# Patient Record
Sex: Female | Born: 1995 | Hispanic: Yes | Smoking: Never smoker
Health system: Southern US, Community
[De-identification: ages and names within clinical notes are randomized; demographics above are authoritative.]

## PROBLEM LIST (undated history)

## (undated) ENCOUNTER — Inpatient Hospital Stay: Payer: Self-pay

## (undated) DIAGNOSIS — F329 Major depressive disorder, single episode, unspecified: Secondary | ICD-10-CM

## (undated) DIAGNOSIS — E0590 Thyrotoxicosis, unspecified without thyrotoxic crisis or storm: Secondary | ICD-10-CM

## (undated) DIAGNOSIS — F32A Depression, unspecified: Secondary | ICD-10-CM

---
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
## 2017-01-16 ENCOUNTER — Other Ambulatory Visit: Payer: Self-pay | Admitting: Family Medicine

## 2017-01-16 DIAGNOSIS — Z3402 Encounter for supervision of normal first pregnancy, second trimester: Secondary | ICD-10-CM

## 2017-02-01 ENCOUNTER — Ambulatory Visit: Payer: Medicaid Other

## 2017-02-06 ENCOUNTER — Ambulatory Visit: Payer: Medicaid Other

## 2017-02-08 ENCOUNTER — Ambulatory Visit
Admission: RE | Admit: 2017-02-08 | Discharge: 2017-02-08 | Disposition: A | Discharge status: Home / Self Care | Payer: Medicaid Other | Source: Ambulatory Visit | Attending: Family Medicine | Admitting: Family Medicine

## 2017-02-08 DIAGNOSIS — Z3A18 18 weeks gestation of pregnancy: Secondary | ICD-10-CM | POA: Insufficient documentation

## 2017-02-08 DIAGNOSIS — Z3402 Encounter for supervision of normal first pregnancy, second trimester: Secondary | ICD-10-CM

## 2017-07-01 ENCOUNTER — Observation Stay
Admission: EM | Admit: 2017-07-01 | Discharge: 2017-07-01 | Disposition: A | Discharge status: Home / Self Care | Payer: Medicaid Other | Attending: Obstetrics and Gynecology | Admitting: Obstetrics and Gynecology

## 2017-07-01 ENCOUNTER — Encounter: Payer: Self-pay | Admitting: *Deleted

## 2017-07-01 DIAGNOSIS — O471 False labor at or after 37 completed weeks of gestation: Principal | ICD-10-CM | POA: Insufficient documentation

## 2017-07-01 DIAGNOSIS — Z3A40 40 weeks gestation of pregnancy: Secondary | ICD-10-CM | POA: Diagnosis not present

## 2017-07-01 DIAGNOSIS — O4290 Premature rupture of membranes, unspecified as to length of time between rupture and onset of labor, unspecified weeks of gestation: Secondary | ICD-10-CM

## 2017-07-01 DIAGNOSIS — O169 Unspecified maternal hypertension, unspecified trimester: Secondary | ICD-10-CM

## 2017-07-01 DIAGNOSIS — O26893 Other specified pregnancy related conditions, third trimester: Secondary | ICD-10-CM

## 2017-07-01 HISTORY — DX: Major depressive disorder, single episode, unspecified: F32.9

## 2017-07-01 HISTORY — DX: Depression, unspecified: F32.A

## 2017-07-01 HISTORY — DX: Thyrotoxicosis, unspecified without thyrotoxic crisis or storm: E05.90

## 2017-07-01 LAB — URINALYSIS, ROUTINE W REFLEX MICROSCOPIC
Bilirubin Urine: NEGATIVE
Glucose, UA: NEGATIVE mg/dL
Hgb urine dipstick: NEGATIVE
Ketones, ur: NEGATIVE mg/dL
LEUKOCYTES UA: NEGATIVE
NITRITE: NEGATIVE
Protein, ur: NEGATIVE mg/dL
SPECIFIC GRAVITY, URINE: 1.009 (ref 1.005–1.030)
pH: 7 (ref 5.0–8.0)

## 2017-07-01 NOTE — Discharge Instructions (Signed)

## 2017-07-01 NOTE — Final Progress Note (Signed)
L&D OB Triage Note  Sandy Hansen is a 22 y.o. unassigned G1P0000 female at 2610w1d, EDD Estimated Date of Delivery: 06/30/17 who presented to triage for complaints of possibly leaking fluid over the past hour.  [facility] She was evaluated by the nurses with no significant findings for ruptured membranes. Vital signs stable (except first 2 BPs with mildly elevated diastolic of 90). An NST was performed and has been reviewed by MD.    Vitals: BPs 128/90, 129/92, 120/89 Vitals:   07/01/17 2211  Temp: 98.3 F (36.8 C)  TempSrc: Oral  Weight: 180 lb (81.6 kg)  Height: 5\' 8"  (1.727 m)   Physical Exam:  Cervix assessed by nurse, closed.    NST INTERPRETATION: Indications: rule out uterine contractions  Mode: External Baseline Rate (A): 135 bpm Variability: Moderate Accelerations: 15 x 15 Decelerations: None     Contraction Frequency (min): 7  Impression: reactive   Labs: Results for orders placed or performed during the hospital encounter of 07/01/17  Urinalysis, Routine w reflex microscopic  Result Value Ref Range   Color, Urine YELLOW (A) YELLOW   APPearance HAZY (A) CLEAR   Specific Gravity, Urine 1.009 1.005 - 1.030   pH 7.0 5.0 - 8.0   Glucose, UA NEGATIVE NEGATIVE mg/dL   Hgb urine dipstick NEGATIVE NEGATIVE   Bilirubin Urine NEGATIVE NEGATIVE   Ketones, ur NEGATIVE NEGATIVE mg/dL   Protein, ur NEGATIVE NEGATIVE mg/dL   Nitrite NEGATIVE NEGATIVE   Leukocytes, UA NEGATIVE NEGATIVE      Plan: NST performed was reviewed and was found to be reactive. Urine with no significant proteinuria. BPs returned to normal. She was discharged home with bleeding/labor precautions.  Continue routine prenatal care. Follow up with OB/GYN as previously scheduled.     Hildred LaserAnika Lennon Boutwell, MD

## 2017-07-02 DIAGNOSIS — O169 Unspecified maternal hypertension, unspecified trimester: Secondary | ICD-10-CM

## 2017-09-22 ENCOUNTER — Encounter (HOSPITAL_COMMUNITY): Payer: Self-pay

## 2018-11-10 ENCOUNTER — Other Ambulatory Visit: Payer: Self-pay | Admitting: Internal Medicine

## 2018-11-10 ENCOUNTER — Other Ambulatory Visit: Payer: Self-pay

## 2018-11-10 DIAGNOSIS — Z20822 Contact with and (suspected) exposure to covid-19: Secondary | ICD-10-CM

## 2018-11-14 LAB — NOVEL CORONAVIRUS, NAA: SARS-CoV-2, NAA: NOT DETECTED

## 2018-11-18 ENCOUNTER — Telehealth: Payer: Self-pay | Admitting: Family Medicine

## 2018-11-18 NOTE — Telephone Encounter (Signed)
Pt given (Not Detected) covid results/ Pt verbalized understanding

## 2019-08-15 ENCOUNTER — Other Ambulatory Visit: Payer: Self-pay

## 2019-08-15 ENCOUNTER — Emergency Department: Payer: Self-pay

## 2019-08-15 DIAGNOSIS — R0602 Shortness of breath: Secondary | ICD-10-CM | POA: Insufficient documentation

## 2019-08-15 DIAGNOSIS — F419 Anxiety disorder, unspecified: Secondary | ICD-10-CM | POA: Insufficient documentation

## 2019-08-15 DIAGNOSIS — E039 Hypothyroidism, unspecified: Secondary | ICD-10-CM | POA: Insufficient documentation

## 2019-08-15 DIAGNOSIS — Z79899 Other long term (current) drug therapy: Secondary | ICD-10-CM | POA: Insufficient documentation

## 2019-08-15 DIAGNOSIS — R002 Palpitations: Secondary | ICD-10-CM | POA: Insufficient documentation

## 2019-08-15 NOTE — ED Triage Notes (Signed)
Pt also complains of left shoulder pain, "wobbly" feeling.

## 2019-08-15 NOTE — ED Notes (Signed)
Spoke with dr. Derrill Kay regarding pt's symptoms and protocol orders placed. Order for pregnancy test received.

## 2019-08-15 NOTE — ED Triage Notes (Signed)
Via interpreter with Farrel Gobble [phone], pt states she became nervous, felt shob and felt like she was going to vomit. Pt states she had tingling in hands and feet. Pt states occurred at 2100. Pt appears in no acute distress. Pt states she feels shob and nervous.

## 2019-08-16 ENCOUNTER — Emergency Department
Admission: EM | Admit: 2019-08-16 | Discharge: 2019-08-16 | Disposition: A | Discharge status: Home / Self Care | Payer: Self-pay | Attending: Emergency Medicine | Admitting: Emergency Medicine

## 2019-08-16 DIAGNOSIS — F419 Anxiety disorder, unspecified: Secondary | ICD-10-CM

## 2019-08-16 LAB — POCT PREGNANCY, URINE: Preg Test, Ur: NEGATIVE

## 2019-08-16 LAB — CBC WITH DIFFERENTIAL/PLATELET
Abs Immature Granulocytes: 0.02 10*3/uL (ref 0.00–0.07)
Basophils Absolute: 0.1 10*3/uL (ref 0.0–0.1)
Basophils Relative: 1 %
Eosinophils Absolute: 0 10*3/uL (ref 0.0–0.5)
Eosinophils Relative: 1 %
HCT: 42.3 % (ref 36.0–46.0)
Hemoglobin: 14.4 g/dL (ref 12.0–15.0)
Immature Granulocytes: 0 %
Lymphocytes Relative: 29 %
Lymphs Abs: 2.1 10*3/uL (ref 0.7–4.0)
MCH: 29 pg (ref 26.0–34.0)
MCHC: 34 g/dL (ref 30.0–36.0)
MCV: 85.3 fL (ref 80.0–100.0)
Monocytes Absolute: 0.3 10*3/uL (ref 0.1–1.0)
Monocytes Relative: 4 %
Neutro Abs: 4.9 10*3/uL (ref 1.7–7.7)
Neutrophils Relative %: 65 %
Platelets: 245 10*3/uL (ref 150–400)
RBC: 4.96 MIL/uL (ref 3.87–5.11)
RDW: 12.8 % (ref 11.5–15.5)
WBC: 7.4 10*3/uL (ref 4.0–10.5)
nRBC: 0 % (ref 0.0–0.2)

## 2019-08-16 LAB — BASIC METABOLIC PANEL
Anion gap: 9 (ref 5–15)
BUN: 7 mg/dL (ref 6–20)
CO2: 23 mmol/L (ref 22–32)
Calcium: 9.2 mg/dL (ref 8.9–10.3)
Chloride: 107 mmol/L (ref 98–111)
Creatinine, Ser: 0.58 mg/dL (ref 0.44–1.00)
GFR calc Af Amer: >60 mL/min (ref >60)
GFR calc non Af Amer: >60 mL/min (ref >60)
Glucose, Bld: 110 mg/dL — ABNORMAL HIGH (ref 70–99)
Potassium: 3.9 mmol/L (ref 3.5–5.1)
Sodium: 139 mmol/L (ref 135–145)

## 2019-08-16 LAB — TSH: TSH: 2.893 u[IU]/mL (ref 0.350–4.500)

## 2019-08-16 LAB — PREGNANCY, URINE: Preg Test, Ur: NEGATIVE

## 2019-08-16 LAB — FIBRIN DERIVATIVES D-DIMER (ARMC ONLY): Fibrin derivatives D-dimer (ARMC): 214.99 ng/mL (FEU) (ref 0.00–499.00)

## 2019-08-16 LAB — T4, FREE: Free T4: 0.89 ng/dL (ref 0.61–1.12)

## 2019-08-16 MED ORDER — HYDROXYZINE HCL 25 MG PO TABS: 25.0000 mg | ORAL_TABLET | Freq: Three times a day (TID) | ORAL | 0 refills | Status: DC | PRN

## 2019-08-16 MED ORDER — SODIUM CHLORIDE 0.9 % IV BOLUS
1000.0000 mL | Freq: Once | INTRAVENOUS | Status: AC
  Administered 2019-08-16: 04:00:00 1000 mL via INTRAVENOUS

## 2019-08-16 NOTE — ED Notes (Signed)
Pt sitting in lobby, texting on phone in no acute distress.

## 2019-08-16 NOTE — Discharge Instructions (Signed)
Your EKG, chest xray and lab tests were all okay today.  Please take hydroxyzine as needed for anxiety, and follow up with your doctor for continued monitoring of your symptoms.

## 2019-08-16 NOTE — ED Provider Notes (Signed)
[facility] Emergency Department Provider Note  ____________________________________________  Time seen: Approximately 4:55 AM  I have reviewed the triage vital signs and the nursing notes.   HISTORY  Chief Complaint Shortness of Breath  Encounter completed with Spanish video interpreter  HPI Sandy Haye is a 24 y.o. female with a history of hypothyroidism and depression who comes the ED complaining of anxiousness, nausea, palpitations, bilateral hands and feet tingling.  Also associated with a feeling of shortness of breath.  Started today at about 9:00 PM, was at its worse initially, and then gradually improved.  Feels like it is mostly gone but still present and she is worried that it may become worse again.  Denies any recent travel trauma hospitalization or surgery.  No history of DVT or PE.  No exogenous hormone use.  States that the feeling is similar to when she had hyperthyroidism.  She is continuing medication for thyroid dysfunction.      Past Medical History:  Diagnosis Date  . Depression   . Hyperthyroidism      Patient Active Problem List   Diagnosis Date Noted  . Elevated blood pressure affecting pregnancy, antepartum 07/02/2017  . Amniotic fluid leaking 07/01/2017  . Labor and delivery indication for care or intervention 07/01/2017     No past surgical history on file.   Prior to Admission medications   Medication Sig Start Date End Date Taking? Authorizing Provider  hydrOXYzine (ATARAX/VISTARIL) 25 MG tablet Take 1 tablet (25 mg total) by mouth 3 (three) times daily as needed for anxiety. 08/16/19   Carrie Mew, MD  Prenatal Vit-Fe Fumarate-FA (MULTIVITAMIN-PRENATAL) 27-0.8 MG TABS tablet Take 1 tablet by mouth daily at 12 noon.    [provider]     Allergies Sulfa antibiotics, Sulfasalazine, and Trimethoprim   No family history on file.  Social History Social History   Tobacco Use  . Smoking status:  Never Smoker  Substance Use Topics  . Alcohol use: No  . Drug use: No    Review of Systems  Constitutional:   No fever or chills.  ENT:   No sore throat. No rhinorrhea. Cardiovascular:   No chest pain or syncope. Respiratory: Positive shortness of breath without cough. Gastrointestinal:   Negative for abdominal pain, vomiting and diarrhea.  Musculoskeletal:   Negative for focal pain or swelling All other systems reviewed and are negative except as documented above in ROS and HPI.  ____________________________________________   PHYSICAL EXAM:  VITAL SIGNS: ED Triage Vitals  Enc Vitals Group     BP 08/15/19 2113 132/73     Pulse Rate 08/15/19 2113 (!) 104     Resp 08/15/19 2113 18     Temp 08/15/19 2113 98.4 F (36.9 C)     Temp Source 08/15/19 2113 Oral     SpO2 08/15/19 2113 100 %     Weight 08/15/19 2125 172 lb (78 kg)     Height 08/15/19 2125 5\' 4"  (1.626 m)     Head Circumference --      Peak Flow --      Pain Score 08/15/19 2124 4     Pain Loc --      Pain Edu? --      Excl. in [place]? --     Vital signs reviewed, nursing assessments reviewed.   Constitutional:   Alert and oriented. Non-toxic appearance. Eyes:   Conjunctivae are normal. EOMI. PERRL. ENT      Head:   Normocephalic and atraumatic.  Nose:   Wearing a mask.      Mouth/Throat:   Wearing a mask.      Neck:   No meningismus. Full ROM. Hematological/Lymphatic/Immunilogical:   No cervical lymphadenopathy. Cardiovascular:   Tachycardia heart rate 100. Symmetric bilateral radial and DP pulses.  No murmurs. Cap refill less than 2 seconds. Respiratory:   Normal respiratory effort without tachypnea/retractions. Breath sounds are clear and equal bilaterally. No wheezes/rales/rhonchi. Gastrointestinal:   Soft and nontender. Non distended. There is no CVA tenderness.  No rebound, rigidity, or guarding. Musculoskeletal:   Normal range of motion in all extremities. No joint effusions.  No lower extremity  tenderness.  No edema. Neurologic:   Normal speech and language.  Motor grossly intact. No acute focal neurologic deficits are appreciated.  Skin:    Skin is warm, dry and intact. No rash noted.  No petechiae, purpura, or bullae.  ____________________________________________    LABS (pertinent positives/negatives) (all labs ordered are listed, but only abnormal results are displayed) Labs Reviewed  BASIC METABOLIC PANEL - Abnormal; Notable for the following components:      Result Value   Glucose, Bld 110 (*)    All other components within normal limits  PREGNANCY, URINE  CBC WITH DIFFERENTIAL/PLATELET  FIBRIN DERIVATIVES D-DIMER ([facility] ONLY)  TSH  T4, FREE  POCT PREGNANCY, URINE   ____________________________________________   EKG  Interpreted by me Sinus tachycardia rate 106.  Normal axis intervals QRS ST segments and T waves.  No evidence of right heart strain  ____________________________________________    RADIOLOGY  DG Chest 2 View  Result Date: 08/15/2019 CLINICAL DATA:  Shortness of breath. EXAM: CHEST - 2 VIEW COMPARISON:  None. FINDINGS: The cardiomediastinal contours are normal. The lungs are clear. Pulmonary vasculature is normal. No consolidation, pleural effusion, or pneumothorax. No acute osseous abnormalities are seen. IMPRESSION: Negative radiographs of the chest. Electronically Signed   By: Narda Rutherford M.D.   On: 08/15/2019 22:02    ____________________________________________   PROCEDURES Procedures  ____________________________________________  DIFFERENTIAL DIAGNOSIS   Hyperthyroidism, pulmonary embolism, anxiety, atrial fibrillation, electrolyte abnormality  CLINICAL IMPRESSION / ASSESSMENT AND PLAN / ED COURSE  Medications ordered in the ED: Medications  sodium chloride 0.9 % bolus 1,000 mL (1,000 mLs Intravenous New Bag/Given 08/16/19 0356)    Pertinent labs & imaging results that were available during my care of the patient were  reviewed by me and considered in my medical decision making (see chart for details).  Laylonie Marzec was evaluated in Emergency Department on 08/16/2019 for the symptoms described in the history of present illness. She was evaluated in the context of the global COVID-19 pandemic, which necessitated consideration that the patient might be at risk for infection with the SARS-CoV-2 virus that causes COVID-19. Institutional protocols and algorithms that pertain to the evaluation of patients at risk for COVID-19 are in a state of rapid change based on information released by regulatory bodies including the CDC and federal and state organizations. These policies and algorithms were followed during the patient's care in the ED.   Patient presents with palpitations and feeling of anxiousness.  Most likely due to anxiety itself, but with her medical history as well as tachycardia and shortness of breath, check labs to evaluate thyroid function, electrolytes, and D-dimer to risk stratify for VTE.  ----------------------------------------- 4:59 AM on 08/16/2019 -----------------------------------------  Labs are all normal, vital signs are normal after IV fluids for hydration.  Stable for discharge home to follow-up with primary care.  Trial  of Vistaril as needed.      ____________________________________________   FINAL CLINICAL IMPRESSION(S) / ED DIAGNOSES    Final diagnoses:  Anxiety     ED Discharge Orders         Ordered    hydrOXYzine (ATARAX/VISTARIL) 25 MG tablet  3 times daily PRN     08/16/19 0455          Portions of this note were generated with dragon dictation software. Dictation errors may occur despite best attempts at proofreading.   Sharman Cheek, MD 08/16/19 [id number]

## 2021-09-11 IMAGING — CR DG CHEST 2V
1 series · 2 of 2 positions shown · non-contrast
Comparison: None.

CLINICAL DATA: Shortness of breath.

EXAM:
CHEST - 2 VIEW

[Series 1: dg chest 2 view · 0.14mm/px · 2 of 2 slices shown]
[im 1/2]
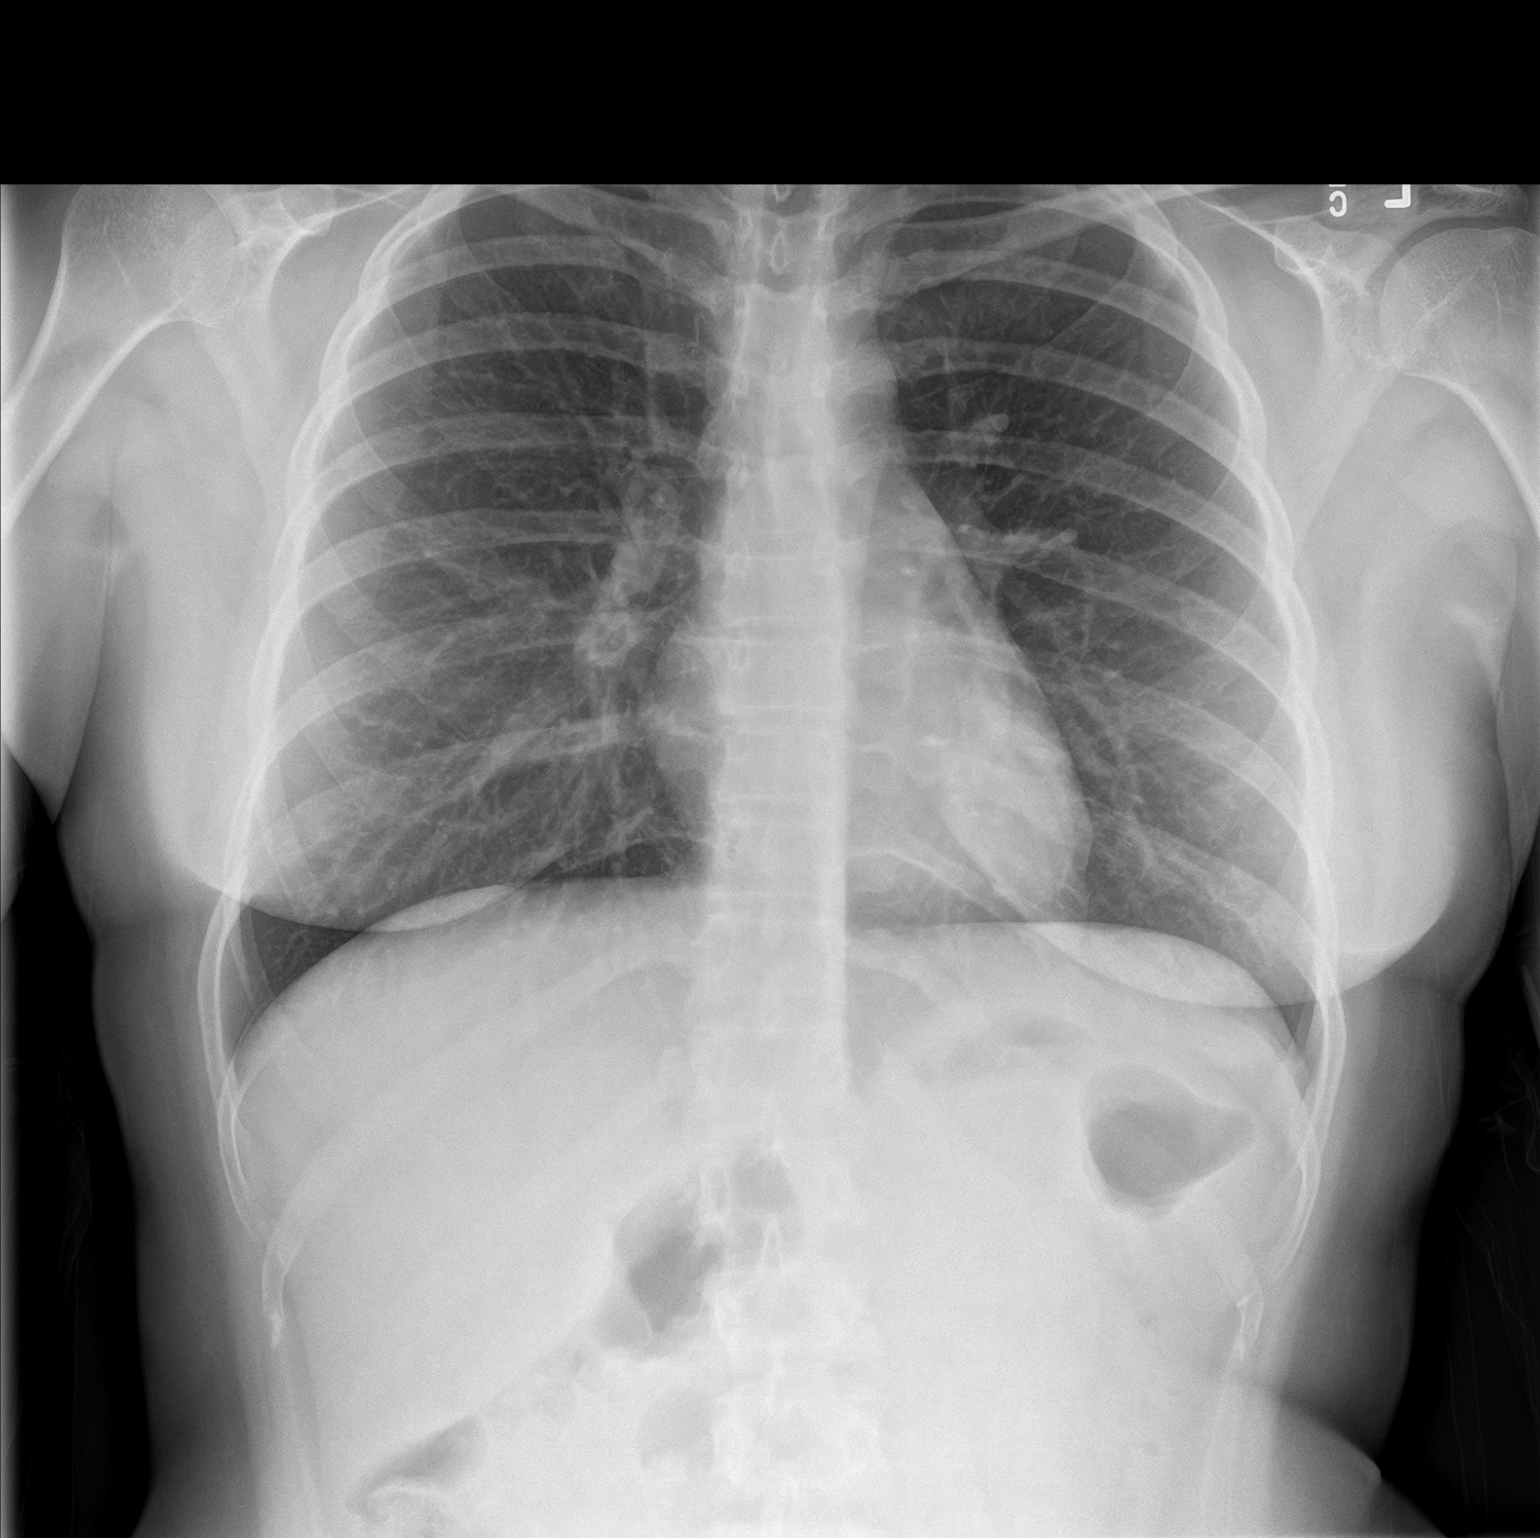
[im 2/2]
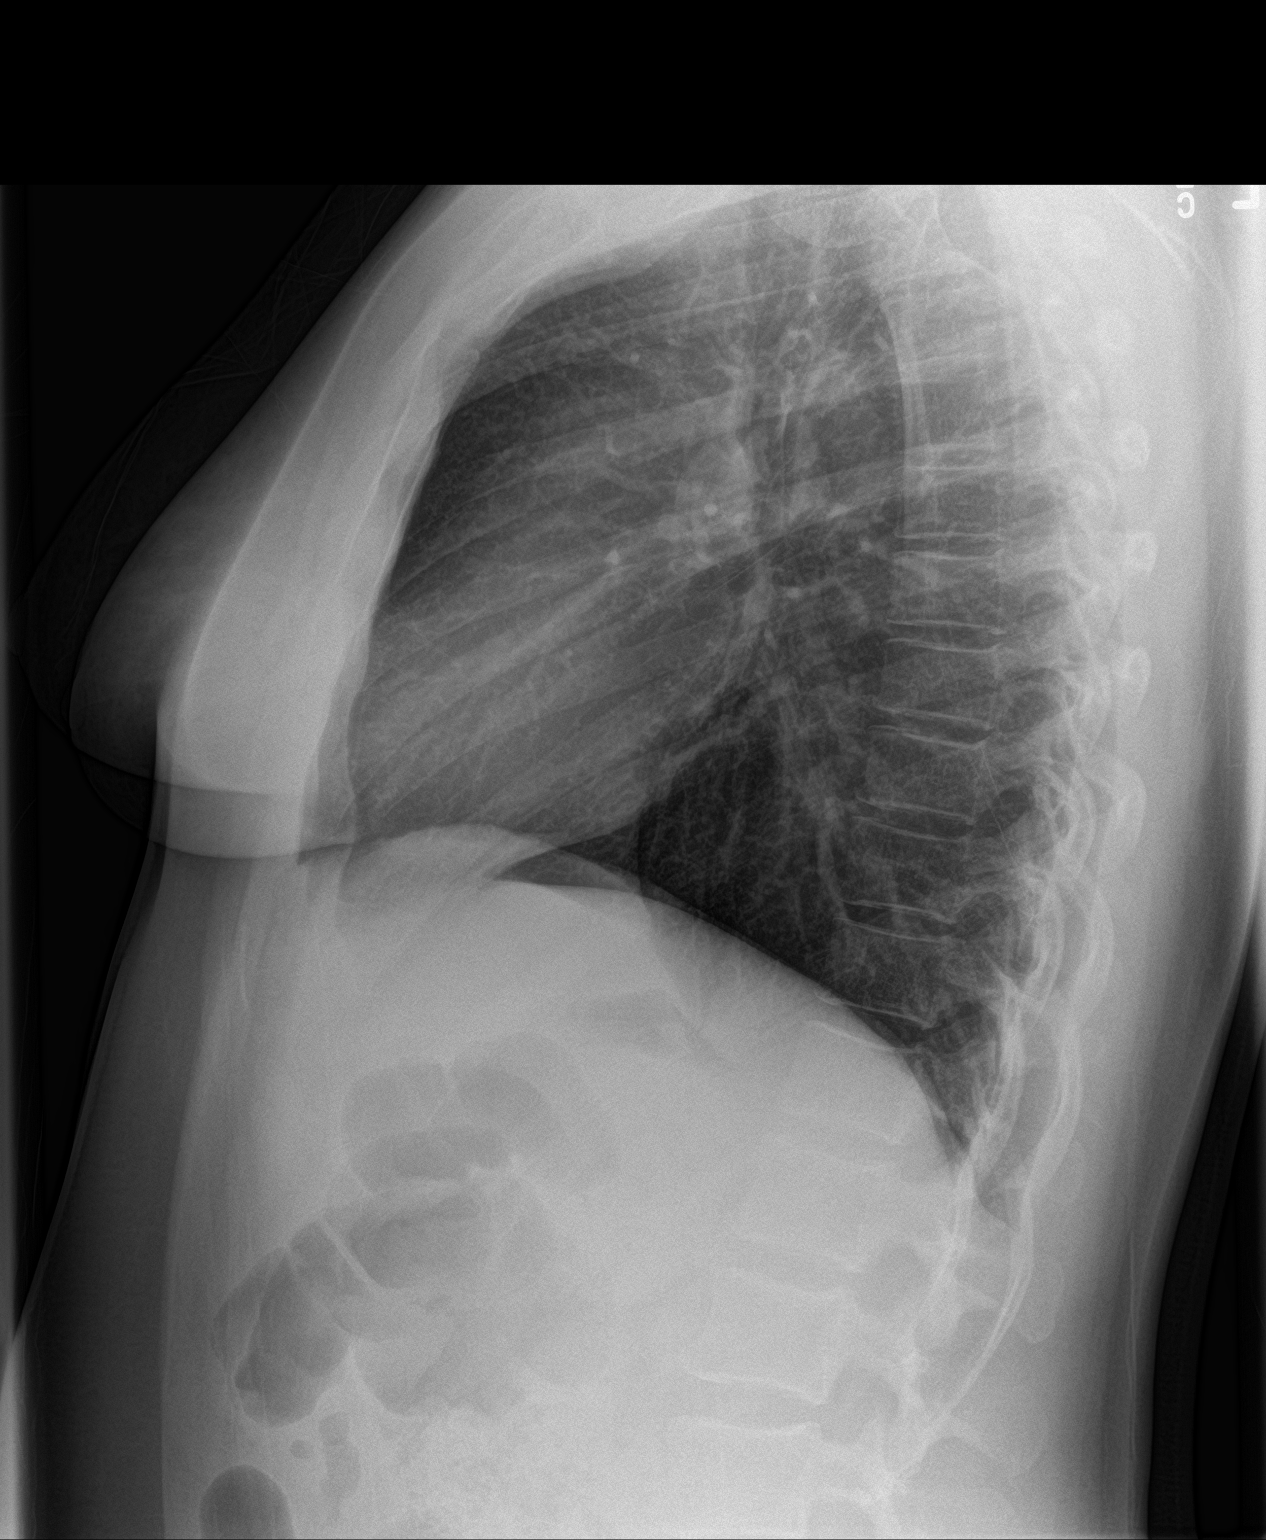

[2 of 2 positions shown; findings below may reference images not displayed]

FINDINGS: The cardiomediastinal contours are normal. The lungs are clear.
Pulmonary vasculature is normal. No consolidation, pleural effusion,
or pneumothorax. No acute osseous abnormalities are seen.
IMPRESSION: Negative radiographs of the chest.

## 2022-05-31 ENCOUNTER — Ambulatory Visit
Admission: RE | Admit: 2022-05-31 | Discharge: 2022-05-31 | Disposition: A | Discharge status: Home / Self Care | Payer: Worker's Compensation | Source: Ambulatory Visit | Attending: Physician Assistant | Admitting: Physician Assistant

## 2022-05-31 ENCOUNTER — Other Ambulatory Visit: Payer: Self-pay | Admitting: Physician Assistant

## 2022-05-31 ENCOUNTER — Ambulatory Visit
Admission: RE | Admit: 2022-05-31 | Discharge: 2022-05-31 | Disposition: A | Discharge status: Home / Self Care | Payer: Worker's Compensation | Attending: Physician Assistant | Admitting: Physician Assistant

## 2022-05-31 DIAGNOSIS — M25572 Pain in left ankle and joints of left foot: Secondary | ICD-10-CM

## 2022-05-31 DIAGNOSIS — M7989 Other specified soft tissue disorders: Secondary | ICD-10-CM | POA: Diagnosis present

## 2023-01-29 ENCOUNTER — Ambulatory Visit (LOCAL_COMMUNITY_HEALTH_CENTER): Payer: Self-pay

## 2023-01-29 VITALS — BP 110/71 | Ht 66.0 in | Wt 178.5 lb

## 2023-01-29 DIAGNOSIS — Z3009 Encounter for other general counseling and advice on contraception: Secondary | ICD-10-CM

## 2023-01-29 DIAGNOSIS — Z3201 Encounter for pregnancy test, result positive: Secondary | ICD-10-CM

## 2023-01-29 LAB — PREGNANCY, URINE: Preg Test, Ur: POSITIVE — AB

## 2023-01-29 MED ORDER — PRENATAL VITAMINS 28-0.8 MG PO TABS: 28.0000 mg | ORAL_TABLET | Freq: Every day | ORAL | Status: AC

## 2023-01-29 NOTE — Progress Notes (Signed)
UPT positive.  Plans prenatal care at [facility] and says has appt in October.  Pts mother accompanies her during visit.  Pt speaks a little [place] but M. [place] provided Product manager.   C/o occasional abdominal discomfort but no bleeding.  Discussed warning signs of ectopic pregnancy and seek emergency care if occurs.    See flow sheet.  Takes Levothyroxine 25 mg daily  and states also takes hydroxyzine sometimes for anxiety.  Given safe meds during pregnancy information and instructed followup with OB provider re meds.   The patient was dispensed prenatal vitamins today. I provided counseling today regarding the medication. Patient given the opportunity to ask questions. Questions answered.    Cherlynn Polo, RN

## 2023-03-06 DIAGNOSIS — R7303 Prediabetes: Secondary | ICD-10-CM | POA: Insufficient documentation

## 2023-03-12 DIAGNOSIS — F4322 Adjustment disorder with anxiety: Secondary | ICD-10-CM | POA: Insufficient documentation

## 2023-04-17 ENCOUNTER — Other Ambulatory Visit: Payer: Self-pay | Admitting: Primary Care

## 2023-04-17 DIAGNOSIS — Z3482 Encounter for supervision of other normal pregnancy, second trimester: Secondary | ICD-10-CM

## 2023-04-30 ENCOUNTER — Ambulatory Visit
Admission: RE | Admit: 2023-04-30 | Discharge: 2023-04-30 | Disposition: A | Discharge status: Home / Self Care | Payer: Medicaid Other | Source: Ambulatory Visit | Attending: Primary Care | Admitting: Primary Care

## 2023-04-30 DIAGNOSIS — Z3482 Encounter for supervision of other normal pregnancy, second trimester: Secondary | ICD-10-CM | POA: Diagnosis present

## 2023-04-30 DIAGNOSIS — Z3A18 18 weeks gestation of pregnancy: Secondary | ICD-10-CM | POA: Diagnosis not present

## 2023-08-07 ENCOUNTER — Observation Stay: Admission: EM | Admit: 2023-08-07 | Discharge: 2023-08-07 | Disposition: A | Discharge status: Home / Self Care

## 2023-08-07 DIAGNOSIS — O99283 Endocrine, nutritional and metabolic diseases complicating pregnancy, third trimester: Secondary | ICD-10-CM | POA: Insufficient documentation

## 2023-08-07 DIAGNOSIS — O4290 Premature rupture of membranes, unspecified as to length of time between rupture and onset of labor, unspecified weeks of gestation: Principal | ICD-10-CM | POA: Diagnosis present

## 2023-08-07 DIAGNOSIS — Z0371 Encounter for suspected problem with amniotic cavity and membrane ruled out: Secondary | ICD-10-CM | POA: Diagnosis present

## 2023-08-07 DIAGNOSIS — O09299 Supervision of pregnancy with other poor reproductive or obstetric history, unspecified trimester: Secondary | ICD-10-CM | POA: Insufficient documentation

## 2023-08-07 DIAGNOSIS — E0590 Thyrotoxicosis, unspecified without thyrotoxic crisis or storm: Secondary | ICD-10-CM | POA: Diagnosis not present

## 2023-08-07 DIAGNOSIS — Z3A32 32 weeks gestation of pregnancy: Secondary | ICD-10-CM | POA: Insufficient documentation

## 2023-08-07 DIAGNOSIS — O4193X0 Disorder of amniotic fluid and membranes, unspecified, third trimester, not applicable or unspecified: Principal | ICD-10-CM | POA: Insufficient documentation

## 2023-08-07 DIAGNOSIS — Z79899 Other long term (current) drug therapy: Secondary | ICD-10-CM | POA: Insufficient documentation

## 2023-08-07 LAB — URINALYSIS, COMPLETE (UACMP) WITH MICROSCOPIC
Bilirubin Urine: NEGATIVE
Glucose, UA: NEGATIVE mg/dL
Hgb urine dipstick: NEGATIVE
Ketones, ur: NEGATIVE mg/dL
Leukocytes,Ua: NEGATIVE
Nitrite: NEGATIVE
Protein, ur: NEGATIVE mg/dL
Specific Gravity, Urine: 1.019 (ref 1.005–1.030)
pH: 6 (ref 5.0–8.0)

## 2023-08-07 LAB — CHLAMYDIA/NGC RT PCR (ARMC ONLY)
Chlamydia Tr: NOT DETECTED
N gonorrhoeae: NOT DETECTED

## 2023-08-07 LAB — WET PREP, GENITAL
Clue Cells Wet Prep HPF POC: NONE SEEN
Sperm: NONE SEEN
Trich, Wet Prep: NONE SEEN
WBC, Wet Prep HPF POC: >=10 — AB (ref <10)
Yeast Wet Prep HPF POC: NONE SEEN

## 2023-08-07 LAB — RUPTURE OF MEMBRANE (ROM)PLUS: Rom Plus: NEGATIVE

## 2023-08-07 MED ORDER — ACETAMINOPHEN 500 MG PO TABS: 1000.0000 mg | ORAL_TABLET | Freq: Four times a day (QID) | ORAL | Status: DC | PRN

## 2023-08-07 MED ORDER — CALCIUM CARBONATE ANTACID 500 MG PO CHEW: 2.0000 | CHEWABLE_TABLET | ORAL | Status: DC | PRN

## 2023-08-07 NOTE — OB Triage Note (Signed)
 Pt Sandy Hansen 28 y.o. presents to labor and delivery triage reporting woke up this morning and the bed was wet, thought it might be her son but wants to be sure. Pt is a G2P1001 at [redacted]w[redacted]d . Pt denies active vaginal bleeding. Pt denies contractions and states positive fetal movement. External FM and TOCO applied to non-tender abdomen and assessing. Initial FHR 130 . Vital signs obtained and within normal limits. Provider notified of pt.

## 2023-08-07 NOTE — OB Triage Note (Signed)
 Discharge instructions, labor precautions, and follow-up care reviewed with patient and significant other. All questions answered. Patient verbalized understanding. Discharged ambulatory off unit.

## 2023-08-07 NOTE — Discharge Summary (Signed)
 Sandy Hansen is a 28 y.o. female. She is at [redacted]w[redacted]d gestation. Patient's last menstrual period was 12/20/2022 (exact date). Estimated Date of Delivery: 09/26/23   [facility] care site[facility]  Chief complaint: gush of fluid   Admission Diagnoses:  1) intrauterine pregnancy at [redacted]w[redacted]d  2) Leakage of amniotic fluid [O42.90]  Discharge Diagnoses:  Principal Problem:   Leakage of amniotic fluid Active Problems:   Encounter for suspected premature rupture of amniotic membranes, with rupture of membranes not found   HPI: Sandy Hansen presents to L&D with complaints of leaking fluid. She woke up this morning and the bed was wet. She thought it might be her son but wanted to make sure her water did not break. She denies continued leaking of fluid and but has had an increase in mucus-like discharge.  Her pregnancy is uncomplicated.  She denies Contractions or Vaginal bleeding. Endorses fetal movement as active.   S: Resting comfortably. no CTX, no VB.no continued LOF,  Active fetal movement.   Maternal Medical History:  Past Medical Hx:  has a past medical history of Depression and Hyperthyroidism.    Past Surgical Hx:  has no past surgical history on file.   Allergies  Allergen Reactions   Sulfa Antibiotics Rash   Sulfasalazine Rash   Trimethoprim Rash     Prior to Admission medications   Medication Sig Start Date End Date Taking? Authorizing Provider  cetirizine (ZYRTEC) 10 MG chewable tablet Chew 10 mg by mouth daily.   Yes [provider]  levothyroxine (SYNTHROID) 50 MCG tablet Take 50 mcg by mouth daily before breakfast.   Yes [provider]  Prenatal Vit-Fe Fumarate-FA (MULTIVITAMIN-PRENATAL) 27-0.8 MG TABS tablet Take 1 tablet by mouth daily at 12 noon.   Yes [provider]  hydrOXYzine (ATARAX/VISTARIL) 25 MG tablet Take 1 tablet (25 mg total) by mouth 3 (three) times daily as needed for anxiety. Patient not taking: Reported on 08/07/2023 08/16/19    Sharman Cheek, MD    Social History: She  reports that she has never smoked. She does not have any smokeless tobacco history on file. She reports that she does not currently use alcohol. She reports that she does not use drugs.  Family History: family history is not on file.   Review of Systems: A full review of systems was performed and negative except as noted in the HPI.     Pertinent Results:   O:  BP 120/72   Pulse 92   Temp 97.8 F (36.6 C) (Oral)   Resp 18   LMP 12/20/2022 (Exact Date)  Results for orders placed or performed during the hospital encounter of 08/07/23 (from the past 48 hours)  Wet prep, genital   Collection Time: 08/07/23  7:52 PM   Specimen: Urine, Clean Catch  Result Value Ref Range   Yeast Wet Prep HPF POC NONE SEEN NONE SEEN   Trich, Wet Prep NONE SEEN NONE SEEN   Clue Cells Wet Prep HPF POC NONE SEEN NONE SEEN   WBC, Wet Prep HPF POC >=10 (A) <10   Sperm NONE SEEN   Urinalysis, Complete w Microscopic -Urine, Clean Catch   Collection Time: 08/07/23  7:52 PM  Result Value Ref Range   Color, Urine YELLOW (A) YELLOW   APPearance HAZY (A) CLEAR   Specific Gravity, Urine 1.019 1.005 - 1.030   pH 6.0 5.0 - 8.0   Glucose, UA NEGATIVE NEGATIVE mg/dL   Hgb urine dipstick NEGATIVE NEGATIVE   Bilirubin Urine  NEGATIVE NEGATIVE   Ketones, ur NEGATIVE NEGATIVE mg/dL   Protein, ur NEGATIVE NEGATIVE mg/dL   Nitrite NEGATIVE NEGATIVE   Leukocytes,Ua NEGATIVE NEGATIVE   RBC / HPF 0-5 0 - 5 RBC/hpf   WBC, UA 0-5 0 - 5 WBC/hpf   Bacteria, UA RARE (A) NONE SEEN   Squamous Epithelial / HPF 0-5 0 - 5 /HPF   Mucus PRESENT   Rupture of Membrane (ROM) Plus   Collection Time: 08/07/23  8:05 PM  Result Value Ref Range   Rom Plus NEGATIVE      Constitutional: NAD, AAOx3  PULM: nl respiratory effort Abd: gravid, non-tender Ext: Non-tender, Nonedmeatous Psych: mood appropriate, speech normal Pelvic : moderate amount of physiologic discharge, pooling negative  with valsalva  SVE: Dilation: Closed Effacement (%): Thick Station:  (high) Exam by:: ATami Lin, CNM    NST: Baseline FHR: 135 beats/min Variability: moderate Accelerations: present Decelerations: absent Tocometry: None Time: at least 20 minutes   Interpretation: Category I INDICATIONS: rule out uterine contractions RESULTS:  A NST procedure was performed with FHR monitoring and a normal baseline established, appropriate time of 20-40 minutes of evaluation, and accels >2 seen w 15x15 characteristics.  Results show a REACTIVE NST.   Consults: None  Procedures: NST  Hospital Course: The patient was admitted to Labor and Delivery Triage for observation. ROM +, wet prep, and UA were all negative. Pelvic exam was reassuring, negative for pooling with valsalva. NST was reactive. PTL precautions were reviewed and warning s/s to return to L&D with. Instructed to follow up with prenatal provider in 1 week or sooner as needed.   She was deemed stable for discharge and further outpatient management.   Discharge Condition: stable  Disposition: Discharge disposition: 01-Home or Self Care        Allergies as of 08/07/2023       Reactions   Sulfa Antibiotics Rash   Sulfasalazine Rash   Trimethoprim Rash        Medication List     STOP taking these medications    hydrOXYzine 25 MG tablet Commonly known as: ATARAX       TAKE these medications    cetirizine 10 MG chewable tablet Commonly known as: ZYRTEC Chew 10 mg by mouth daily.   levothyroxine 50 MCG tablet Commonly known as: SYNTHROID Take 50 mcg by mouth daily before breakfast.   multivitamin-prenatal 27-0.8 MG Tabs tablet Take 1 tablet by mouth daily at 12 noon.        Follow-up Information     Center, [facility]. Schedule an appointment as soon as possible for a visit in 1 week(s).   Specialty: General Practice Why: routine prenatal appointment Contact information: [address] [place] [address] [phone]                ----- Gustavo Lah, CNM  Certified Nurse Midwife [facility]
